# Patient Record
Sex: Male | Born: 1965 | Race: Asian | Hispanic: No
Health system: Southern US, Community
[De-identification: ages and names within clinical notes are randomized; demographics above are authoritative.]

---

## 2019-07-05 ENCOUNTER — Ambulatory Visit: Payer: Self-pay | Attending: Internal Medicine

## 2019-07-05 DIAGNOSIS — Z23 Encounter for immunization: Secondary | ICD-10-CM

## 2019-07-05 NOTE — Progress Notes (Signed)
   Covid-19 Vaccination Clinic  Name:  Isaac Woods    MRN: [id number] DOB: [birth year]  07/05/2019  Isaac Woods was observed post Covid-19 immunization for 15 minutes without incident. He was provided with Vaccine Information Sheet and instruction to access the V-Safe system.   Isaac Woods was instructed to call 911 with any severe reactions post vaccine: Marland Kitchen Difficulty breathing  . Swelling of face and throat  . A fast heartbeat  . A bad rash all over body  . Dizziness and weakness   Immunizations Administered    Name Date Dose VIS Date Route   Pfizer COVID-19 Vaccine 07/05/2019  3:10 PM 0.3 mL 03/15/2019 Intramuscular   Manufacturer: [employer], [employer]   Lot: [id number]   NDC: [address]-8

## 2019-07-31 ENCOUNTER — Ambulatory Visit: Payer: Self-pay | Attending: Internal Medicine

## 2019-07-31 DIAGNOSIS — Z23 Encounter for immunization: Secondary | ICD-10-CM

## 2019-07-31 NOTE — Progress Notes (Signed)
   Covid-19 Vaccination Clinic  Name:  [place]    MRN: [id number] DOB: 02/23/1966  07/31/2019  Mr. Capri was observed post Covid-19 immunization for 15 minutes without incident. He was provided with Vaccine Information Sheet and instruction to access the V-Safe system.   Mr. Hofland was instructed to call 911 with any severe reactions post vaccine: Marland Kitchen Difficulty breathing  . Swelling of face and throat  . A fast heartbeat  . A bad rash all over body  . Dizziness and weakness   Immunizations Administered    Name Date Dose VIS Date Route   Pfizer COVID-19 Vaccine 07/31/2019  4:42 PM 0.3 mL 05/29/2018 Intramuscular   Manufacturer: [employer], [employer]   Lot: [id number]   NDC: [address]-8

## 2020-08-08 ENCOUNTER — Emergency Department (HOSPITAL_BASED_OUTPATIENT_CLINIC_OR_DEPARTMENT_OTHER): Payer: Managed Care, Other (non HMO)

## 2020-08-08 ENCOUNTER — Encounter (HOSPITAL_BASED_OUTPATIENT_CLINIC_OR_DEPARTMENT_OTHER): Payer: Self-pay | Admitting: *Deleted

## 2020-08-08 ENCOUNTER — Other Ambulatory Visit: Payer: Self-pay

## 2020-08-08 ENCOUNTER — Emergency Department (HOSPITAL_BASED_OUTPATIENT_CLINIC_OR_DEPARTMENT_OTHER)
Admission: EM | Admit: 2020-08-08 | Discharge: 2020-08-08 | Disposition: A | Discharge status: Home / Self Care | Payer: Managed Care, Other (non HMO) | Attending: Emergency Medicine | Admitting: Emergency Medicine

## 2020-08-08 DIAGNOSIS — W010XXA Fall on same level from slipping, tripping and stumbling without subsequent striking against object, initial encounter: Secondary | ICD-10-CM | POA: Diagnosis not present

## 2020-08-08 DIAGNOSIS — S53105A Unspecified dislocation of left ulnohumeral joint, initial encounter: Secondary | ICD-10-CM

## 2020-08-08 DIAGNOSIS — S53125A Posterior dislocation of left ulnohumeral joint, initial encounter: Secondary | ICD-10-CM | POA: Diagnosis not present

## 2020-08-08 DIAGNOSIS — W19XXXA Unspecified fall, initial encounter: Secondary | ICD-10-CM

## 2020-08-08 DIAGNOSIS — S59902A Unspecified injury of left elbow, initial encounter: Secondary | ICD-10-CM | POA: Diagnosis present

## 2020-08-08 DIAGNOSIS — S52045A Nondisplaced fracture of coronoid process of left ulna, initial encounter for closed fracture: Secondary | ICD-10-CM | POA: Insufficient documentation

## 2020-08-08 MED ORDER — HYDROMORPHONE HCL 1 MG/ML IJ SOLN
1.0000 mg | Freq: Once | INTRAMUSCULAR | Status: DC
  Filled 2020-08-08: qty 1

## 2020-08-08 MED ORDER — MORPHINE SULFATE (PF) 2 MG/ML IV SOLN
2.0000 mg | Freq: Once | INTRAVENOUS | Status: AC
  Administered 2020-08-08: 2 mg via INTRAVENOUS
  Filled 2020-08-08: qty 1

## 2020-08-08 MED ORDER — PROPOFOL 10 MG/ML IV BOLUS
INTRAVENOUS | Status: AC | PRN
  Administered 2020-08-08: 30 mg via INTRAVENOUS
  Administered 2020-08-08: 35 mg via INTRAVENOUS

## 2020-08-08 MED ORDER — PROPOFOL 10 MG/ML IV BOLUS
1.0000 mg/kg | Freq: Once | INTRAVENOUS | Status: AC
  Administered 2020-08-08: 30 mg via INTRAVENOUS
  Filled 2020-08-08: qty 20

## 2020-08-08 MED ORDER — HYDROMORPHONE HCL 1 MG/ML IJ SOLN
1.0000 mg | Freq: Once | INTRAMUSCULAR | Status: AC
  Administered 2020-08-08: 1 mg via INTRAVENOUS
  Filled 2020-08-08: qty 1

## 2020-08-08 NOTE — ED Provider Notes (Signed)
.  Sedation  Date/Time: 08/08/2020 7:07 PM Performed by: Tilden Fossa, MD Authorized by: Tilden Fossa, MD   Consent:    Consent obtained:  Verbal   Consent given by:  Patient   Risks discussed:  Dysrhythmia and inadequate sedation Universal protocol:    Immediately prior to procedure, a time out was called: yes     Patient identity confirmed:  Verbally with patient Indications:    Procedure performed:  Dislocation reduction Pre-sedation assessment:    Time since last food or drink:  6   ASA classification: class 1 - normal, healthy patient     Mallampati score:  II - soft palate, uvula, fauces visible   Neck mobility: normal     Pre-sedation assessments completed and reviewed: airway patency, cardiovascular function, hydration status, mental status, nausea/vomiting, pain level and respiratory function   Immediate pre-procedure details:    Reassessment: Patient reassessed immediately prior to procedure     Reviewed: relevant labs/tests     Verified: bag valve mask available, emergency equipment available, intubation equipment available, IV patency confirmed, oxygen available and suction available   Procedure details (see MAR for exact dosages):    Preoxygenation:  Nasal cannula   Sedation:  Propofol   Intended level of sedation: deep   Analgesia:  Hydromorphone   Intra-procedure monitoring:  Blood pressure monitoring, continuous capnometry, continuous pulse oximetry, cardiac monitor, frequent LOC assessments and frequent vital sign checks   Intra-procedure events: none     Total Provider sedation time (minutes):  15 Post-procedure details:    Patient is stable for discharge or admission: yes     Procedure completion:  Tolerated well, no immediate complications      Tilden Fossa, MD 08/10/20 0002

## 2020-08-08 NOTE — Discharge Instructions (Signed)
You came to the emergency department today to be evaluated for your left elbow pain.  Your elbow was found to be dislocated and you are found to have a fracture of your coronoid process.  Your dislocation was reduced.  You were placed in the splint and sling.  It is very important you do not remove this splint as you are elbow is unstable and will come back out of position if support is removed.  Please see attached information on proper splint and sling use.  You will be contacted by the office of Dr. Susa Simmonds.  If you do not hear back from his office by 5/13 please use information provided here to contact his office.  Please take Ibuprofen (Advil, motrin) and Tylenol (acetaminophen) to relieve your pain.    You may take up to 600 MG (3 pills) of normal strength ibuprofen every 8 hours as needed.   You make take tylenol, up to 1,000 mg (two extra strength pills) every 8 hours as needed.   It is safe to take ibuprofen and tylenol at the same time as they work differently.   Do not take more than 3,000 mg tylenol in a 24 hour period (not more than one dose every 8 hours.  Please check all medication labels as many medications such as pain and cold medications may contain tylenol.  Do not drink alcohol while taking these medications.  Do not take other NSAID'S while taking ibuprofen (such as aleve or naproxen).  Please take ibuprofen with food to decrease stomach upset.   Get help right away if: You lose feeling in your arm or hand. Your arm or hand becomes pale and cold.

## 2020-08-08 NOTE — ED Notes (Signed)
Pt discharged to home. Discharge instructions have been discussed with patient and/or family members. Pt verbally acknowledges understanding d/c instructions, and endorses comprehension to checkout at registration before leaving.  °

## 2020-08-08 NOTE — Sedation Documentation (Signed)
MD and EMT applying splint. Pt awake and responsive

## 2020-08-08 NOTE — Sedation Documentation (Signed)
Unable to rate pain due to sedation.  

## 2020-08-08 NOTE — ED Notes (Signed)
Pt. Had a fall earlier today causing injury to the  L arm / L elbow.

## 2020-08-08 NOTE — ED Notes (Signed)
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

## 2020-08-08 NOTE — ED Provider Notes (Addendum)
[facility] EMERGENCY DEPARTMENT Provider Note   CSN: [id number] Arrival date & time: 08/08/20  1404     History Chief Complaint  Patient presents with  . Fall    L arm pain    Isaac Woods is a 55 y.o. male with no past pertinent medical history.  Patient presents with chief complaint of fall and left elbow pain.  Patient reports fall occurred just prior to arrival in the emergency department.  Patient was standing at ground-level when he slipped and fell landing on his left side.  Patient denies hitting his head or any loss of consciousness.  Patient endorses pain to left elbow.  Patient rates pain 8/10 on the pain scale.  Pain is worse with movement or touch.  Pain is improved when arm is kept in neutral position.  Patient denies taking any blood thinners.  Patient denies any headache, neck pain, back pain, numbness to extremities, weakness to extremities, saddle anesthesia, visual disturbance, nausea, vomiting.  Patient is right-hand dominant.  HPI     No past medical history on file.  There are no problems to display for this patient.      No family history on file.     Home Medications Prior to Admission medications   Not on File    Allergies    Patient has no allergy information on record.  Review of Systems   Review of Systems  Eyes: Negative for visual disturbance.  Respiratory: Negative for shortness of breath.   Cardiovascular: Negative for chest pain.  Gastrointestinal: Negative for abdominal pain, nausea and vomiting.  Musculoskeletal: Positive for arthralgias. Negative for back pain, gait problem, joint swelling, neck pain and neck stiffness.  Skin: Negative for color change, pallor, rash and wound.  Neurological: Negative for dizziness, tremors, seizures, syncope, facial asymmetry, speech difficulty, weakness, light-headedness, numbness and headaches.  Psychiatric/Behavioral: Negative for confusion.    Physical Exam Updated Vital  Signs BP 121/84 (BP Location: Right Arm)   Pulse 63   Temp 97.8 F (36.6 C) (Oral)   Resp 18   Ht 5\' 11"  (1.803 m)   Wt 73.5 kg   SpO2 99%   BMI 22.59 kg/m   Physical Exam Vitals and nursing note reviewed.  Constitutional:      General: He is not in acute distress.    Appearance: He is not ill-appearing, toxic-appearing or diaphoretic.  HENT:     Head: Normocephalic and atraumatic. No raccoon eyes, Battle's sign, abrasion, contusion, masses, right periorbital erythema, left periorbital erythema or laceration.  Eyes:     General: No scleral icterus.       Right eye: No discharge.        Left eye: No discharge.     Extraocular Movements: Extraocular movements intact.     Pupils: Pupils are equal, round, and reactive to light.  Cardiovascular:     Rate and Rhythm: Normal rate.  Pulmonary:     Effort: Pulmonary effort is normal. No respiratory distress.     Breath sounds: No stridor.  Chest:     Chest wall: No mass, lacerations, deformity, swelling, tenderness, crepitus or edema.  Abdominal:     General: There is no distension. There are no signs of injury.     Tenderness: There is no abdominal tenderness. There is no guarding or rebound.  Musculoskeletal:     Left shoulder: No swelling, deformity, effusion, laceration, tenderness, bony tenderness or crepitus.     Left upper arm: No swelling, edema, deformity,  lacerations, tenderness or bony tenderness.     Left elbow: Deformity present. No swelling. Decreased range of motion. Tenderness present in medial epicondyle, lateral epicondyle and olecranon process.     Left forearm: Tenderness (proximal forearm) present. No swelling, edema, deformity, lacerations or bony tenderness.     Left wrist: No swelling, deformity, effusion, lacerations, tenderness, bony tenderness, snuff box tenderness or crepitus. Normal range of motion. Normal pulse.     Left hand: No swelling, deformity, lacerations, tenderness or bony tenderness. Normal  range of motion. Normal sensation. Normal capillary refill.     Cervical back: Normal, normal range of motion and neck supple. No edema, erythema, signs of trauma, rigidity, torticollis or crepitus. No pain with movement, spinous process tenderness or muscular tenderness. Normal range of motion.     Thoracic back: No swelling, edema, deformity, signs of trauma, lacerations, tenderness or bony tenderness.     Lumbar back: No swelling, edema, deformity, signs of trauma, lacerations, spasms, tenderness or bony tenderness.     Comments: No midline tenderness, deformity, or step-off to cervical, thoracic, or lumbar spine  Patient has obvious deformity to left elbow with tenderness to palpation +3 left radial pulse, sensation intact to all digits of left hand, cap refill less than 2 seconds in all digits of left hand  Patient has no tenderness, bony tenderness, or deformity to palpation of right upper extremity, and below her lower extremities.  Skin:    General: Skin is warm and dry.     Coloration: Skin is not jaundiced or pale.  Neurological:     General: No focal deficit present.     Mental Status: He is alert and oriented to person, place, and time.     GCS: GCS eye subscore is 4. GCS verbal subscore is 5. GCS motor subscore is 6.  Psychiatric:        Behavior: Behavior is cooperative.     ED Results / Procedures / Treatments   Labs (all labs ordered are listed, but only abnormal results are displayed) Labs Reviewed - No data to display  EKG None  Radiology DG Elbow 2 Views Left  Result Date: 08/08/2020 CLINICAL DATA:  Post reduction images. EXAM: LEFT ELBOW - 2 VIEW COMPARISON:  Aug 08, 2020 (2:34 p.m.) FINDINGS: A small fracture fragment is seen, likely originating from the coronoid process. This is present on the prior study. There is no evidence of dislocation. Mild soft tissue swelling is seen. IMPRESSION: 1. Interval reduction of the left elbow dislocation seen on the prior study.  2. Fracture of the left coronary process. Electronically Signed   By: Aram Candela M.D.   On: 08/08/2020 16:26   DG Elbow Complete Left  Result Date: 08/08/2020 CLINICAL DATA:  Recent fall with left elbow pain, initial encounter EXAM: LEFT ELBOW - COMPLETE 3+ VIEW COMPARISON:  None. FINDINGS: There is posterior dislocation of the proximal radius and ulna with respect to the distal humerus. Small avulsion fractures are noted likely from the coronoid process. No discrete radial head fracture is noted. Joint effusion is seen. IMPRESSION: Fracture dislocation of the elbow joint with posterior and superior displacement of the radius and ulna with respect to the distal humerus. Multiple small avulsion is a are identified as described. Electronically Signed   By: Alcide Clever M.D.   On: 08/08/2020 15:07   CT Elbow Left Wo Contrast  Result Date: 08/08/2020 CLINICAL DATA:  Elbow fracture post reduction. EXAM: CT OF THE UPPER LEFT EXTREMITY WITHOUT CONTRAST  TECHNIQUE: Multidetector CT imaging of the left elbow was performed according to the standard protocol. COMPARISON:  Radiographs same date FINDINGS: Bones/Joint/Cartilage The previously demonstrated posterior elbow dislocation has been reduced. There is a fracture of the coronoid process with a fracture fragment measuring up to 1.8 cm on image 115/5. This fracture appears mildly comminuted. The radial head and distal humerus appear intact. There are probable additional small intra-articular fracture fragments posterior to the medial humeral condyle (image 102/4). Moderate-sized elbow joint effusion noted. Ligaments Suboptimally assessed by CT. Muscles and Tendons Unremarkable.  The biceps and triceps tendons appear grossly intact. Soft tissues No evidence of foreign body or soft tissue emphysema. IMPRESSION: 1. Successful reduction of previously demonstrated posterior elbow dislocation. 2. Comminuted fracture of the coronoid process with probable additional  small intra-articular fracture fragments posteriorly in the joint. No other fracture donor site identified. Moderate size hemarthrosis. Electronically Signed   By: Carey Bullocks M.D.   On: 08/08/2020 17:56    Procedures Reduction of dislocation  Date/Time: 08/09/2020 2:54 AM Performed by: Haskel Schroeder, PA-C Authorized by: Haskel Schroeder, PA-C  Consent: Verbal consent obtained. Written consent obtained. Consent given by: patient Patient understanding: patient states understanding of the procedure being performed Patient consent: the patient's understanding of the procedure matches consent given Procedure consent: procedure consent matches procedure scheduled Relevant documents: relevant documents present and verified Imaging studies: imaging studies available Patient identity confirmed: verbally with patient and arm band Time out: Immediately prior to procedure a "time out" was called to verify the correct patient, procedure, equipment, support staff and site/side marked as required. Local anesthesia used: no  Anesthesia: Local anesthesia used: no  Sedation: Patient sedated: yes  Patient tolerance: patient tolerated the procedure well with no immediate complications Comments: Reduction of posterior and superior displacement of the radius and ulna with respect to the distal humerus.  Procedure was performed by myself and Dr. Pecola Leisure.  Procedural sedation using propofol administered by Dr. Pecola Leisure, please see her note for further details.  Successful reduction of left elbow was performed.  At pulse, motor, sensation intact after reduction.  Patient was immediately placed in posterior long-arm splint due to instability of joint.      Medications Ordered in ED Medications  morphine 2 MG/ML injection 2 mg (2 mg Intravenous Given 08/08/20 1432)  propofol (DIPRIVAN) 10 mg/mL bolus/IV push 73.5 mg (30 mg Intravenous Given by Other 08/08/20 1554)  HYDROmorphone (DILAUDID) injection 1  mg (1 mg Intravenous Given 08/08/20 1517)  propofol (DIPRIVAN) 10 mg/mL bolus/IV push (35 mg Intravenous Given by Other 08/08/20 1558)    ED Course  I have reviewed the triage vital signs and the nursing notes.  Pertinent labs & imaging results that were available during my care of the patient were reviewed by me and considered in my medical decision making (see chart for details).    MDM Rules/Calculators/A&P                          Alert 55 year old male no acute stress, nontoxic-appearing.  Patient complains after suffering a mechanical fall at ground-level.  Patient denies hitting his head or any loss of consciousness.  Endorses pain to left elbow.  Patient has obvious deformity to left elbow.  Low suspicion for intracranial or spinal injury as patient has no headache, neck pain, back pain.  No midline tenderness, step-off, or deformity to cervical, thoracic, or lumbar spine.  Head is atraumatic without battle sign  or raccoon eyes.  As no numbness, weakness, saddle anesthesia.  Will obtain x-ray imaging of left elbow.  We will give patient 2 mg of morphine for pain management as he is nave to opiate medication.  Given an additional 1 mg Dilaudid to help with pain management.  X-ray imaging shows fracture dislocation of the elbow joint with posterior and superior displacement of the radius and ulna with respect of the distal humerus.  Multiple small avulsion are identified.  Procedural sedation and reduction of elbow was performed.  Patient tolerated procedure well.  Patient had pulse, motor, and sensation intact distally after successful reduction.  Patient was placed in a posterior long-arm splint immediately following reduction due to instability of left elbow with flexion and extension.  We will reach out to orthopedic team for follow-up and need for CT imaging.  1645 spoke with Dr. Susa Simmonds, recommended that patient have CT scan, be placed in a long-arm posterior slab splint.  We will  have his elbow specialist reach out to patient for follow-up sometime next week.  Noncontrast CT of left elbow showed: 1) successful reduction of previously demonstrated posterior elbow dislocation 2) comminuted fracture of the coronoid process with probable additional small intra-articular fracture fragments posteriorly in the joint.  Moderate-sized hemarthrosis  Patient was informed of CT scan findings.  Patient hemodynamically stable.  Patient was placed in sling.  Will discharge patient to follow-up with orthopedic surgeon.  Discussed results, findings, treatment and follow up. Patient advised of return precautions. Patient verbalized understanding and agreed with plan.   Final Clinical Impression(s) / ED Diagnoses Final diagnoses:  Dislocation of left elbow, initial encounter  Closed nondisplaced fracture of coronoid process of left ulna, initial encounter    Rx / DC Orders ED Discharge Orders    None       Creedon, Danielski, PA-C 08/09/20 0259    Haskel Schroeder, PA-C 08/09/20 0300    Tilden Fossa, MD 08/10/20 1112

## 2020-08-08 NOTE — ED Triage Notes (Signed)
Pt. Reports a fall and now has L arm pain. Pt. Reports he fell from a standing position onto the L elbow on a wooden floor. Pt. Is able to move the fingers on the L hand and feels the L hand and All fingers on the L hand. Reports to RN no LOC at time of fall but felt dizziness at time of fall.

## 2020-08-08 NOTE — ED Notes (Signed)
ED Provider at bedside. 

## 2020-08-08 NOTE — ED Notes (Signed)
Patient transported to CT 

## 2022-04-27 IMAGING — DX DG ELBOW 2V*L*
2 series · 2 of 2 positions shown · non-contrast
Comparison: [DATE] [DATE], [DATE] ([DATE] p.m.)

CLINICAL DATA: Post reduction images.

EXAM:
LEFT ELBOW - 2 VIEW

[elbow ap]
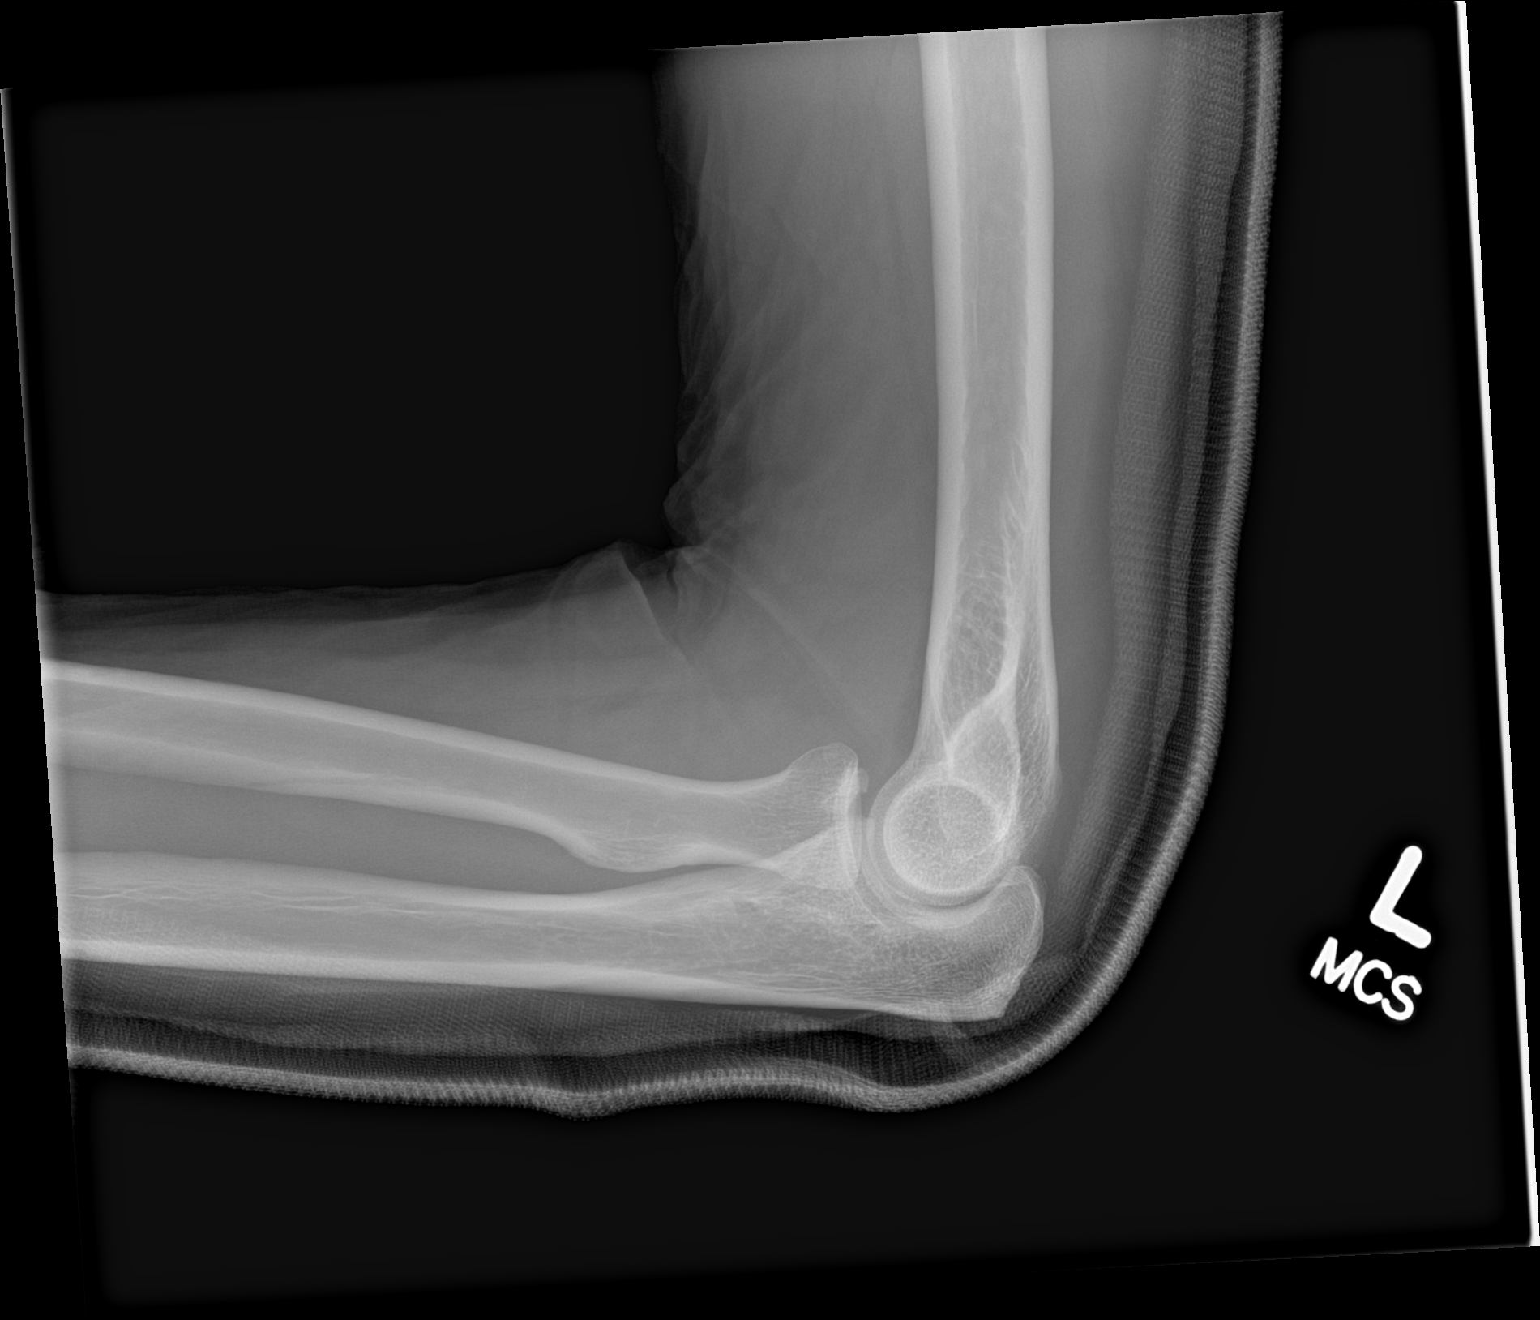

[elbow lat]
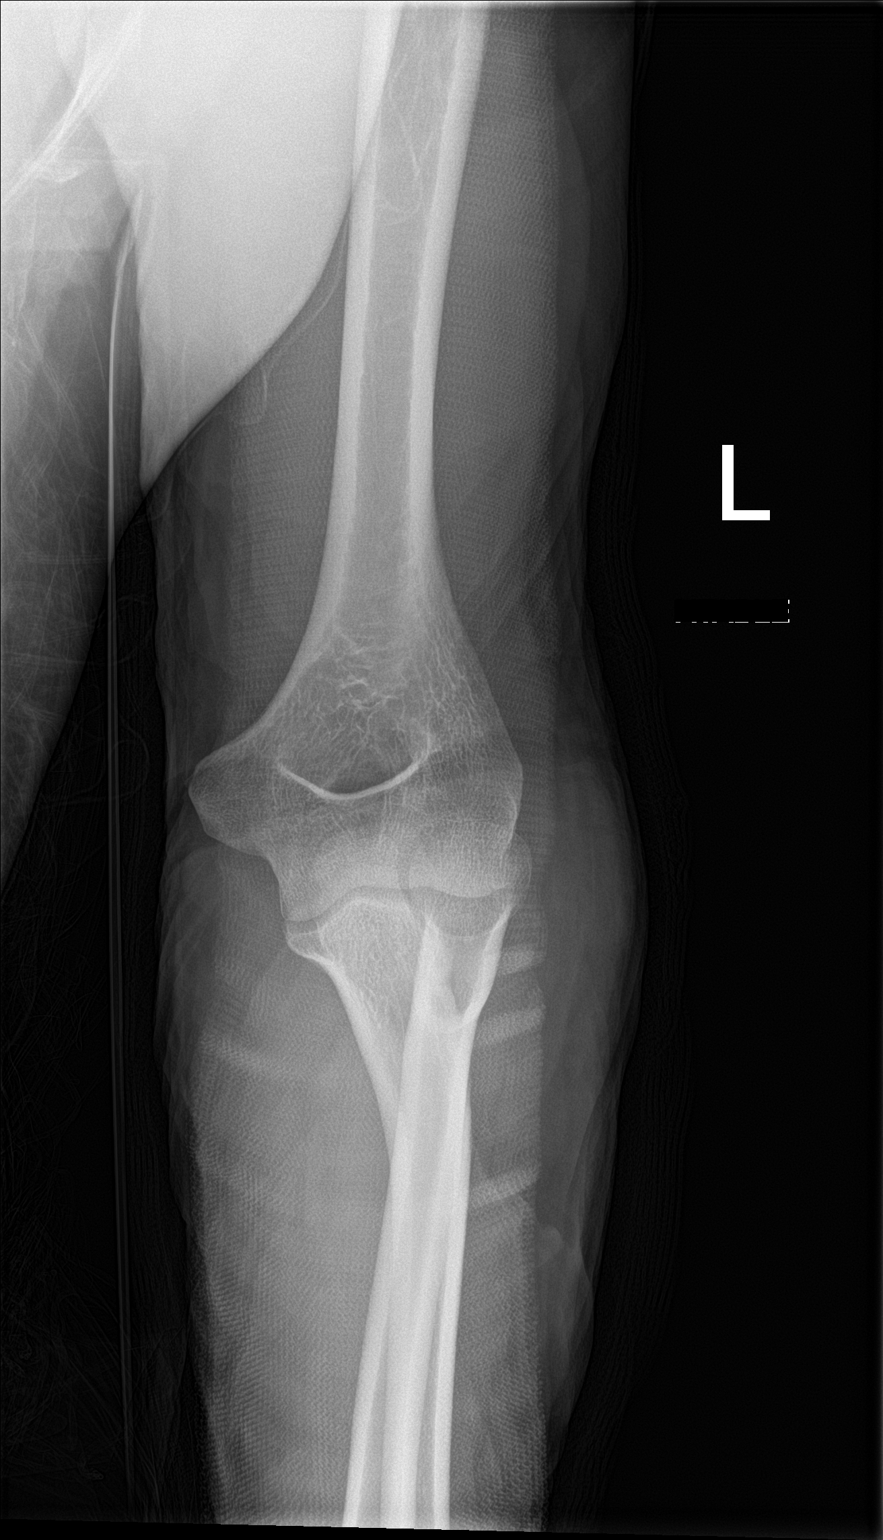

[2 of 2 positions shown; findings below may reference images not displayed]

FINDINGS: A small fracture fragment is seen, likely originating from the
coronoid process. This is present on the prior study. There is no
evidence of dislocation. Mild soft tissue swelling is seen.
IMPRESSION: 1. Interval reduction of the left elbow dislocation seen on the
prior study.
2. Fracture of the left coronary process.

## 2022-04-27 IMAGING — DX DG ELBOW COMPLETE 3+V*L*
5 series · 5 of 5 positions shown · non-contrast
Comparison: None.

CLINICAL DATA: Recent fall with left elbow pain, initial encounter

EXAM:
LEFT ELBOW - COMPLETE 3+ VIEW

[elbow ap]
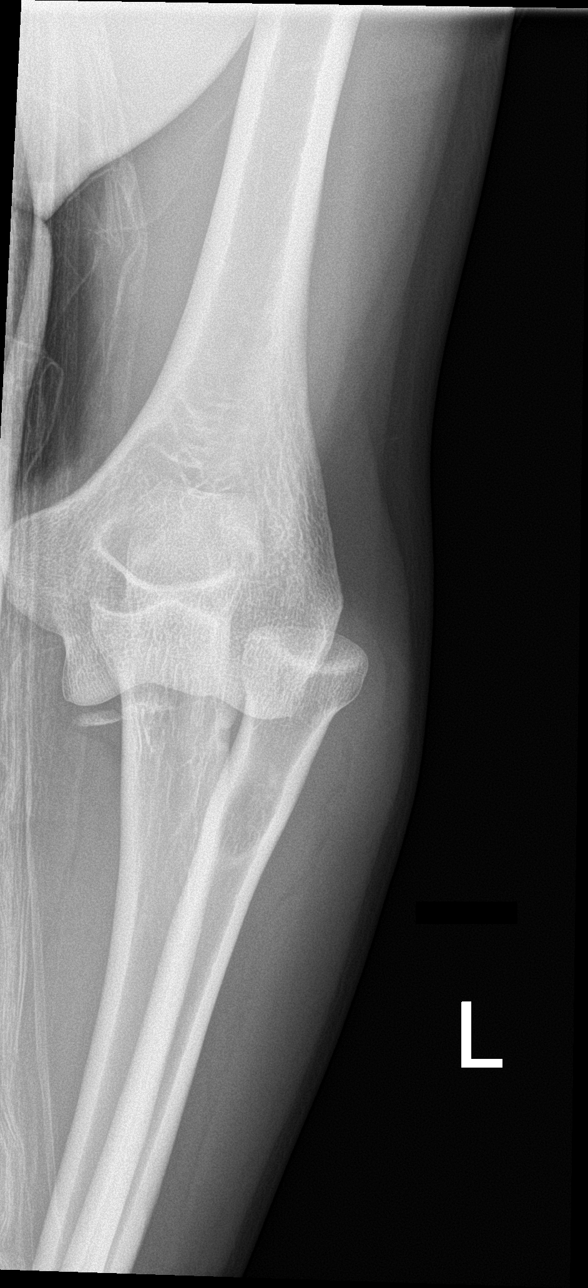

[elbow lat (1 of 2)]
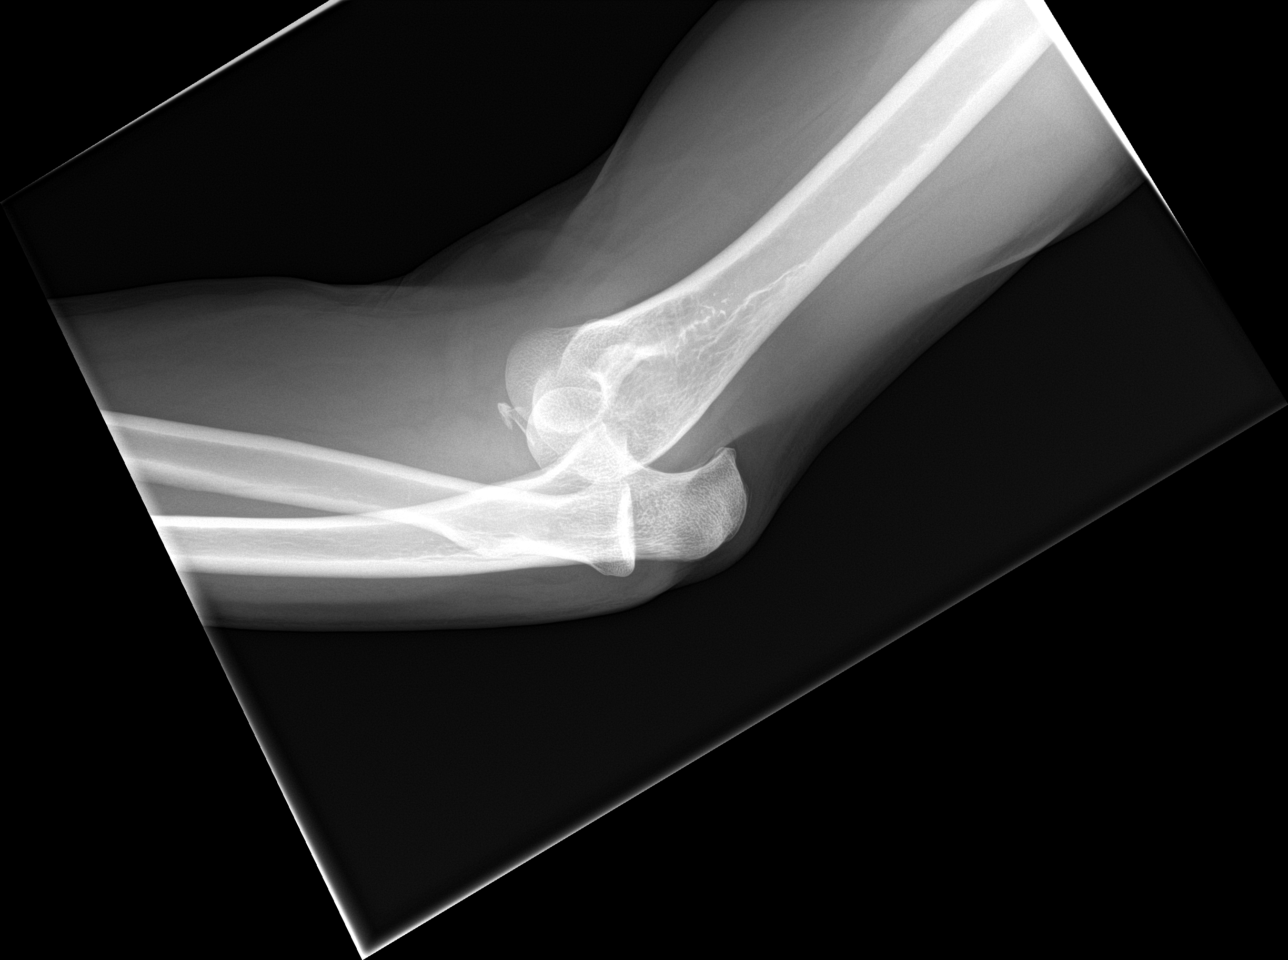

[elbow lat (2 of 2)]
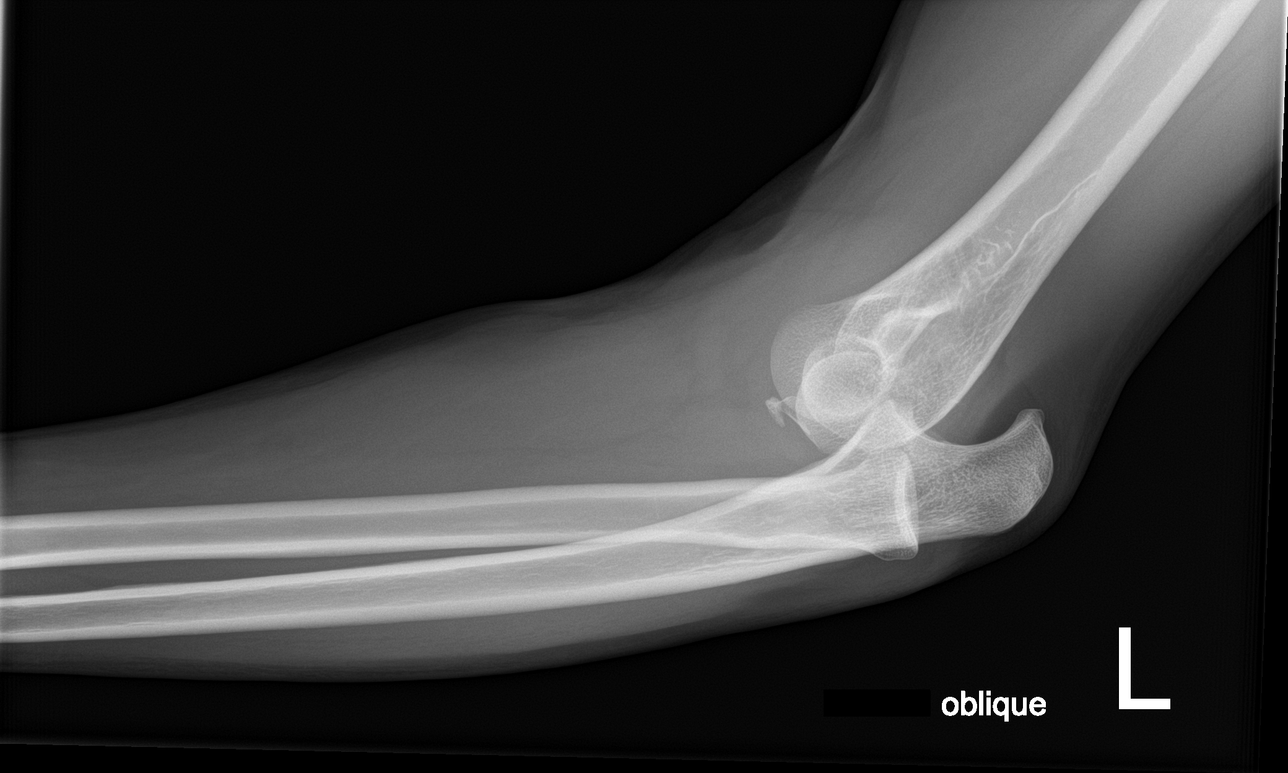

[elbow obl (1 of 2)]
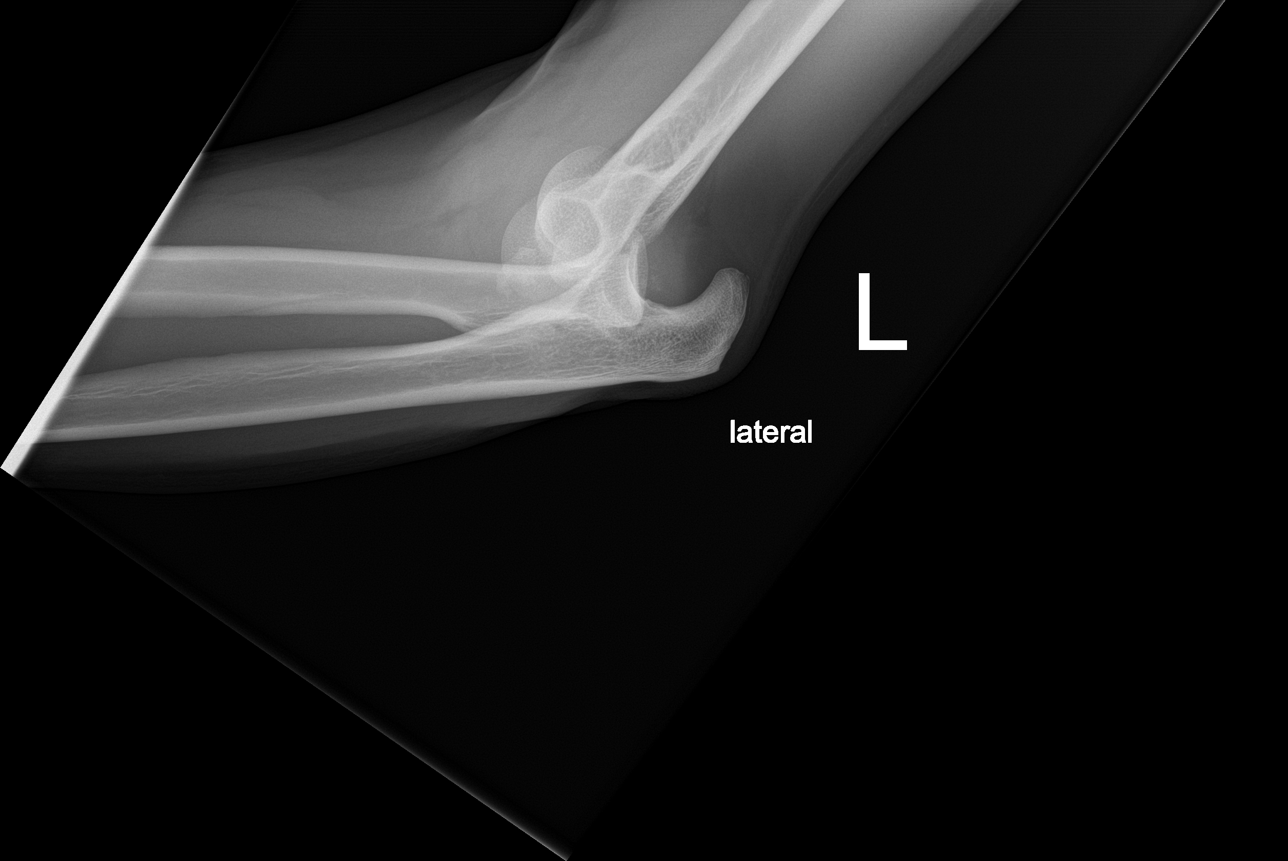

[elbow obl (2 of 2)]
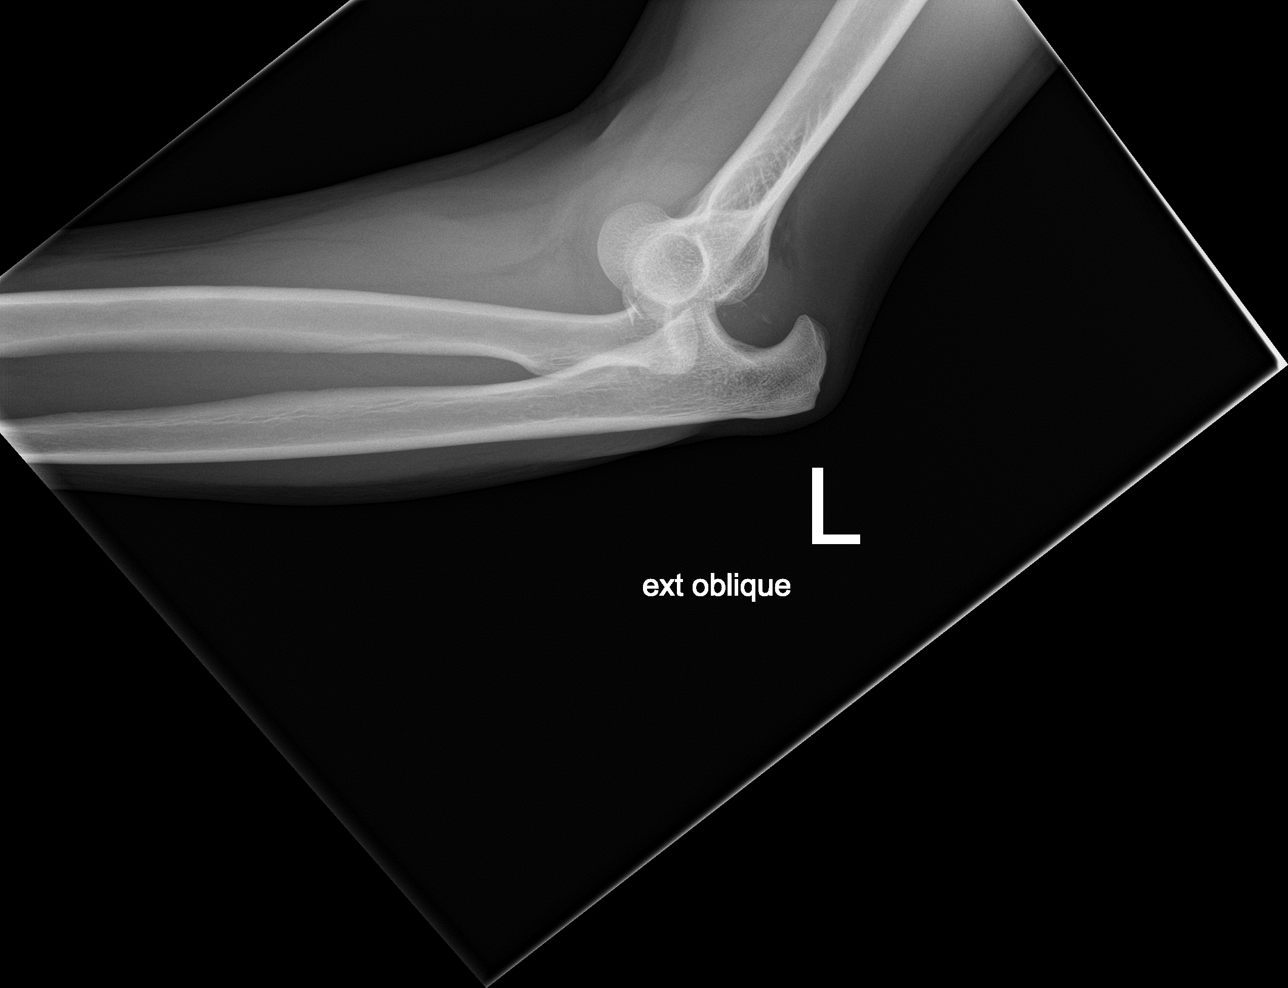

[5 of 5 positions shown; findings below may reference images not displayed]

FINDINGS: There is posterior dislocation of the proximal radius and ulna with
respect to the distal humerus. Small avulsion fractures are noted
likely from the coronoid process. No discrete radial head fracture
is noted. Joint effusion is seen.
IMPRESSION: Fracture dislocation of the elbow joint with posterior and superior
displacement of the radius and ulna with respect to the distal
humerus. Multiple small avulsion is a are identified as described.
